# Patient Record
Sex: Female | Born: 1960 | Race: White | Hispanic: No | Marital: Married | Smoking: Never smoker
Health system: Southern US, Community
[De-identification: ages and names within clinical notes are randomized; demographics above are authoritative.]

## PROBLEM LIST (undated history)

## (undated) DIAGNOSIS — E039 Hypothyroidism, unspecified: Secondary | ICD-10-CM

## (undated) DIAGNOSIS — F329 Major depressive disorder, single episode, unspecified: Secondary | ICD-10-CM

## (undated) DIAGNOSIS — F32A Depression, unspecified: Secondary | ICD-10-CM

## (undated) DIAGNOSIS — R32 Unspecified urinary incontinence: Secondary | ICD-10-CM

## (undated) DIAGNOSIS — Z889 Allergy status to unspecified drugs, medicaments and biological substances status: Secondary | ICD-10-CM

## (undated) HISTORY — DX: Depression, unspecified: F32.A

## (undated) HISTORY — DX: Unspecified urinary incontinence: R32

## (undated) HISTORY — DX: Major depressive disorder, single episode, unspecified: F32.9

## (undated) HISTORY — DX: Allergy status to unspecified drugs, medicaments and biological substances: Z88.9

## (undated) HISTORY — DX: Hypothyroidism, unspecified: E03.9

---

## 1999-01-06 ENCOUNTER — Other Ambulatory Visit: Admission: RE | Admit: 1999-01-06 | Discharge: 1999-01-06 | Payer: Self-pay | Admitting: *Deleted

## 1999-12-16 ENCOUNTER — Other Ambulatory Visit: Admission: RE | Admit: 1999-12-16 | Discharge: 1999-12-16 | Payer: Self-pay | Admitting: *Deleted

## 2000-06-05 ENCOUNTER — Inpatient Hospital Stay (HOSPITAL_COMMUNITY): Admission: AD | Admit: 2000-06-05 | Discharge: 2000-06-08 | Payer: Self-pay | Admitting: Obstetrics and Gynecology

## 2000-06-05 ENCOUNTER — Encounter (INDEPENDENT_AMBULATORY_CARE_PROVIDER_SITE_OTHER): Payer: Self-pay | Admitting: Specialist

## 2000-07-12 ENCOUNTER — Other Ambulatory Visit: Admission: RE | Admit: 2000-07-12 | Discharge: 2000-07-12 | Payer: Self-pay | Admitting: *Deleted

## 2001-07-20 ENCOUNTER — Other Ambulatory Visit: Admission: RE | Admit: 2001-07-20 | Discharge: 2001-07-20 | Payer: Self-pay | Admitting: *Deleted

## 2002-08-15 ENCOUNTER — Other Ambulatory Visit: Admission: RE | Admit: 2002-08-15 | Discharge: 2002-08-15 | Payer: Self-pay | Admitting: *Deleted

## 2003-12-09 ENCOUNTER — Other Ambulatory Visit: Admission: RE | Admit: 2003-12-09 | Discharge: 2003-12-09 | Payer: Self-pay | Admitting: Internal Medicine

## 2004-02-19 ENCOUNTER — Ambulatory Visit (HOSPITAL_COMMUNITY): Admission: RE | Admit: 2004-02-19 | Discharge: 2004-02-19 | Payer: Self-pay | Admitting: Internal Medicine

## 2005-08-05 ENCOUNTER — Ambulatory Visit (HOSPITAL_COMMUNITY): Admission: RE | Admit: 2005-08-05 | Discharge: 2005-08-05 | Payer: Self-pay | Admitting: Internal Medicine

## 2005-12-29 ENCOUNTER — Other Ambulatory Visit: Admission: RE | Admit: 2005-12-29 | Discharge: 2005-12-29 | Payer: Self-pay | Admitting: Internal Medicine

## 2006-03-03 ENCOUNTER — Ambulatory Visit: Payer: Self-pay | Admitting: Family Medicine

## 2006-03-03 LAB — CONVERTED CEMR LAB
ALT: 25 units/L (ref 0–40)
AST: 20 units/L (ref 0–37)
Albumin: 3.6 g/dL (ref 3.5–5.2)
Alkaline Phosphatase: 62 units/L (ref 39–117)
BUN: 7 mg/dL (ref 6–23)
Basophils Absolute: 0 10*3/uL (ref 0.0–0.1)
Basophils Relative: 0.2 % (ref 0.0–1.0)
CO2: 30 meq/L (ref 19–32)
Calcium: 9.1 mg/dL (ref 8.4–10.5)
Chloride: 100 meq/L (ref 96–112)
Creatinine, Ser: 0.8 mg/dL (ref 0.4–1.2)
Eosinophil percent: 3.3 % (ref 0.0–5.0)
GFR calc non Af Amer: 83 mL/min
Glomerular Filtration Rate, Af Am: 101 mL/min/{1.73_m2}
Glucose, Bld: 77 mg/dL (ref 70–99)
HCT: 36.8 % (ref 36.0–46.0)
Hemoglobin: 12.3 g/dL (ref 12.0–15.0)
Lymphocytes Relative: 27.7 % (ref 12.0–46.0)
MCHC: 33.5 g/dL (ref 30.0–36.0)
MCV: 91.1 fL (ref 78.0–100.0)
Monocytes Absolute: 0.6 10*3/uL (ref 0.2–0.7)
Monocytes Relative: 8.4 % (ref 3.0–11.0)
Neutro Abs: 4.2 10*3/uL (ref 1.4–7.7)
Neutrophils Relative %: 60.4 % (ref 43.0–77.0)
Platelets: 376 10*3/uL (ref 150–400)
Potassium: 3.5 meq/L (ref 3.5–5.1)
RBC: 4.04 M/uL (ref 3.87–5.11)
RDW: 13.6 % (ref 11.5–14.6)
Sodium: 138 meq/L (ref 135–145)
TSH: 2.09 microintl units/mL (ref 0.35–5.50)
Total Bilirubin: 0.3 mg/dL (ref 0.3–1.2)
Total Protein: 7.1 g/dL (ref 6.0–8.3)
WBC: 6.9 10*3/uL (ref 4.5–10.5)

## 2006-03-10 ENCOUNTER — Encounter: Admission: RE | Admit: 2006-03-10 | Discharge: 2006-03-10 | Payer: Self-pay | Admitting: Family Medicine

## 2006-03-14 ENCOUNTER — Ambulatory Visit: Payer: Self-pay | Admitting: Family Medicine

## 2006-03-14 LAB — CONVERTED CEMR LAB
FSH: 28.9 milliintl units/mL
LH: 6.4 milliintl units/mL
Testosterone, total: 0.2181 ng/mL

## 2006-06-10 ENCOUNTER — Ambulatory Visit: Payer: Self-pay | Admitting: Family Medicine

## 2006-09-29 ENCOUNTER — Telehealth: Payer: Self-pay | Admitting: Family Medicine

## 2006-10-04 ENCOUNTER — Telehealth (INDEPENDENT_AMBULATORY_CARE_PROVIDER_SITE_OTHER): Payer: Self-pay | Admitting: *Deleted

## 2007-03-09 ENCOUNTER — Telehealth (INDEPENDENT_AMBULATORY_CARE_PROVIDER_SITE_OTHER): Payer: Self-pay | Admitting: *Deleted

## 2007-03-29 ENCOUNTER — Ambulatory Visit (HOSPITAL_COMMUNITY): Admission: RE | Admit: 2007-03-29 | Discharge: 2007-03-29 | Payer: Self-pay | Admitting: Family Medicine

## 2007-03-31 ENCOUNTER — Encounter (INDEPENDENT_AMBULATORY_CARE_PROVIDER_SITE_OTHER): Payer: Self-pay | Admitting: *Deleted

## 2007-04-04 ENCOUNTER — Telehealth (INDEPENDENT_AMBULATORY_CARE_PROVIDER_SITE_OTHER): Payer: Self-pay | Admitting: *Deleted

## 2008-02-13 IMAGING — US US TRANSVAGINAL NON-OB
1 series · 14 of 25 positions shown · non-contrast
Comparison: none

CLINICAL DATA: Vaginal bleeding.  Abdominal bloating.
 TRANSABDOMINAL AND TRANSVAGINAL PELVIC ULTRASOUND:
TECHNIQUE: Both transabdominal and transvaginal ultrasound examinations of the pelvis were performed including evaluation of the uterus, ovaries, adnexal regions, and pelvic cul-de-sac.

[Series 1: us transvaginal non-ob · 0.32mm/px · 14 of 59 slices shown]
[im 1/59]
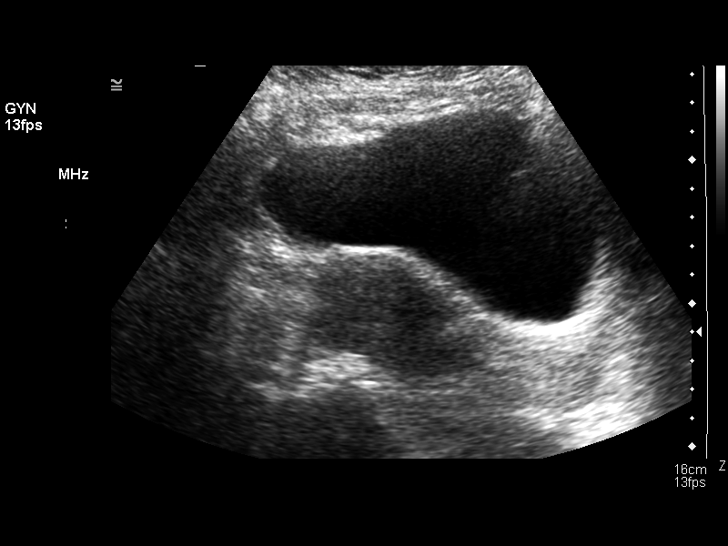
[im 5/59]
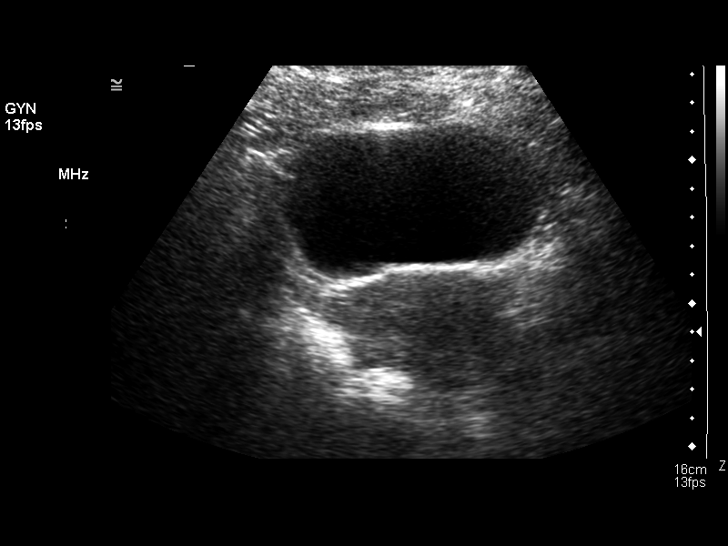
[im 10/59]
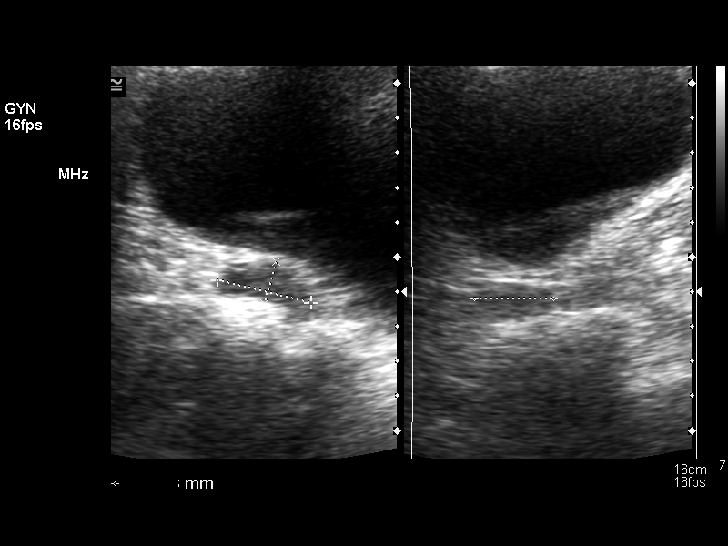
[im 15/59]
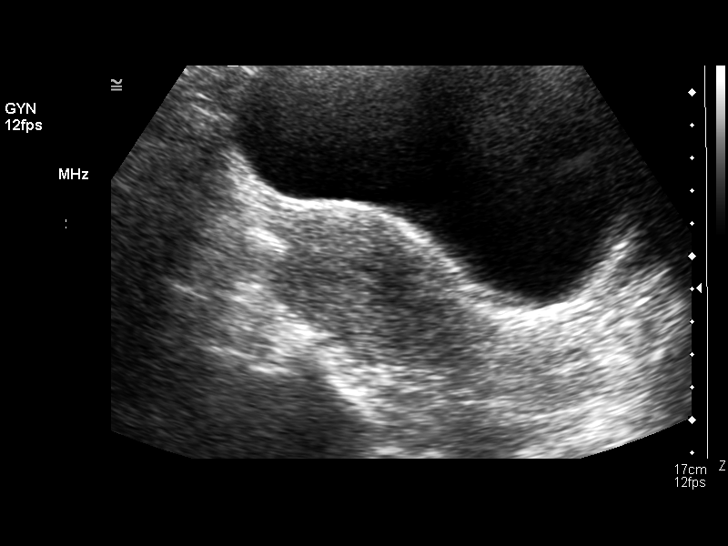
[im 20/59]
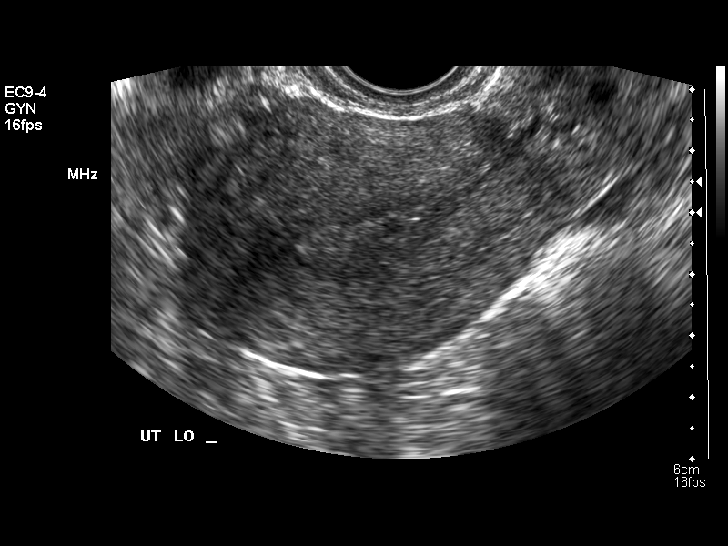
[im 22/59]
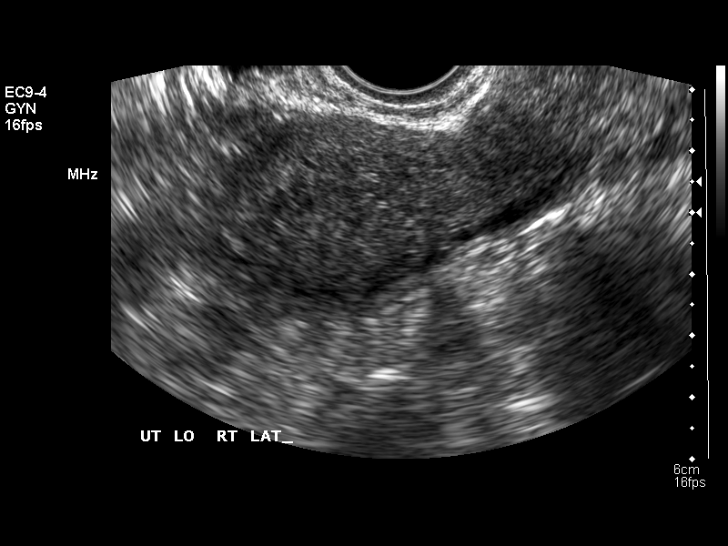
[im 27/59]
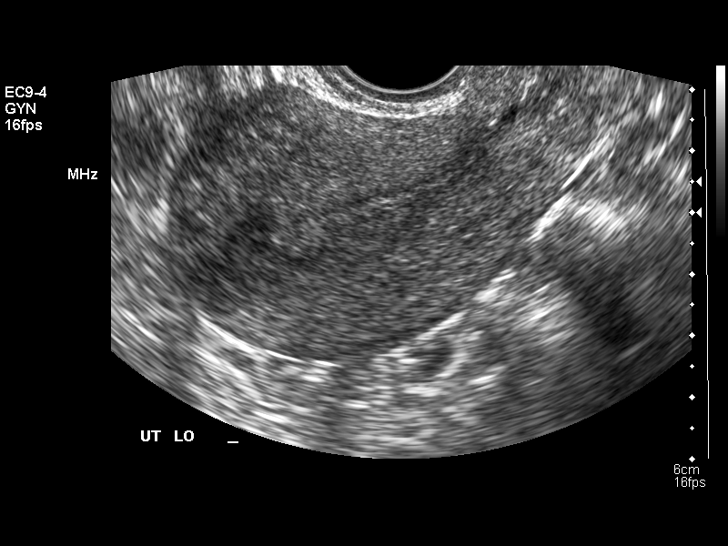
[im 32/59]
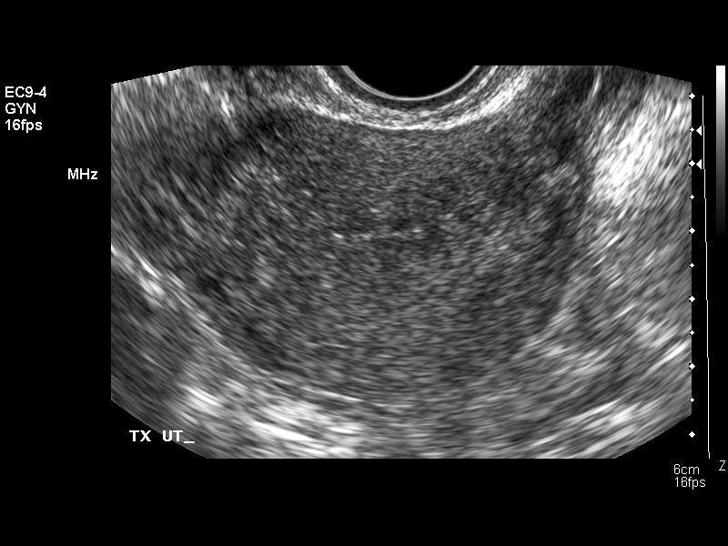
[im 37/59]
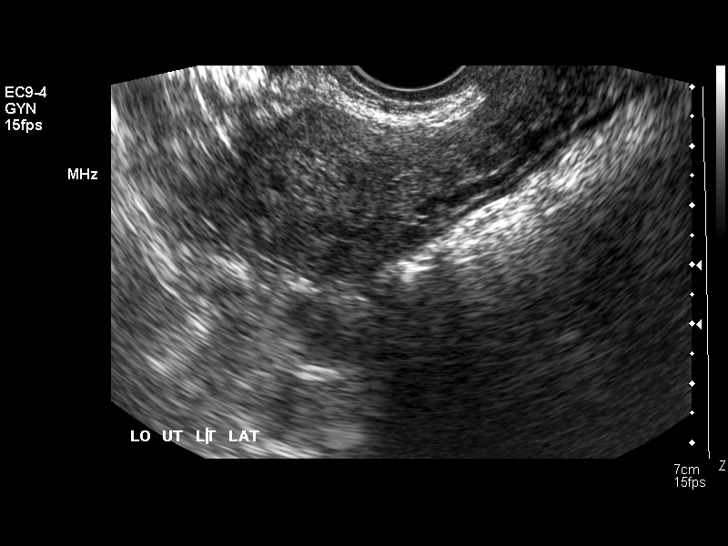
[im 39/59]
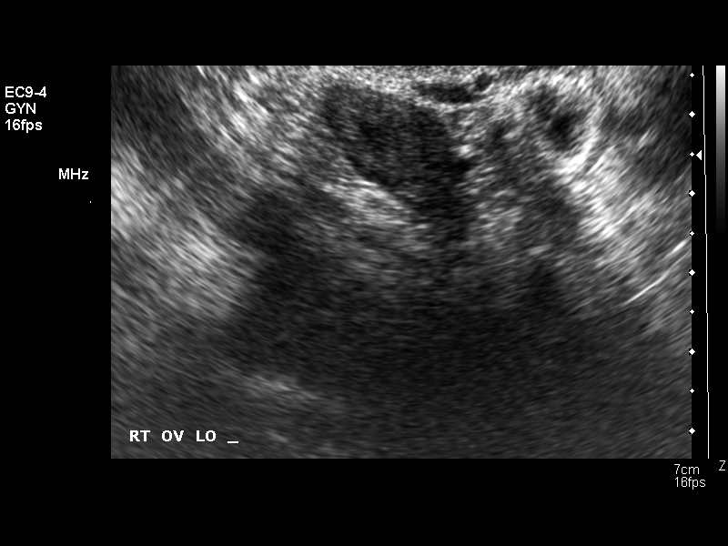
[im 44/59]
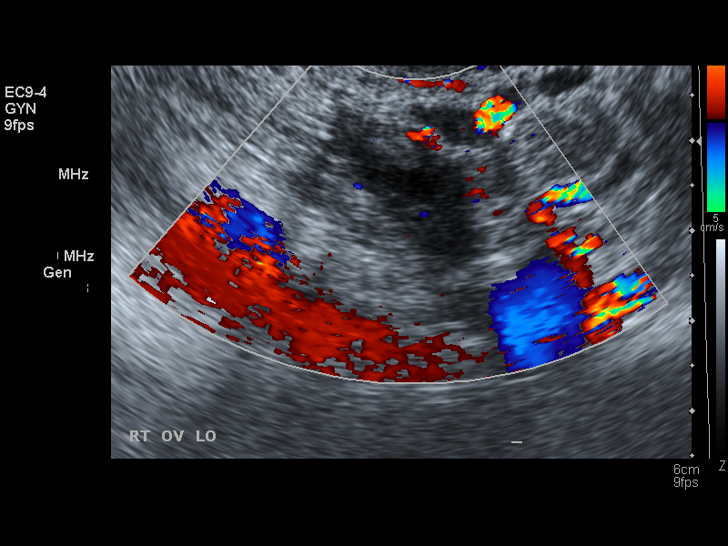
[im 49/59]
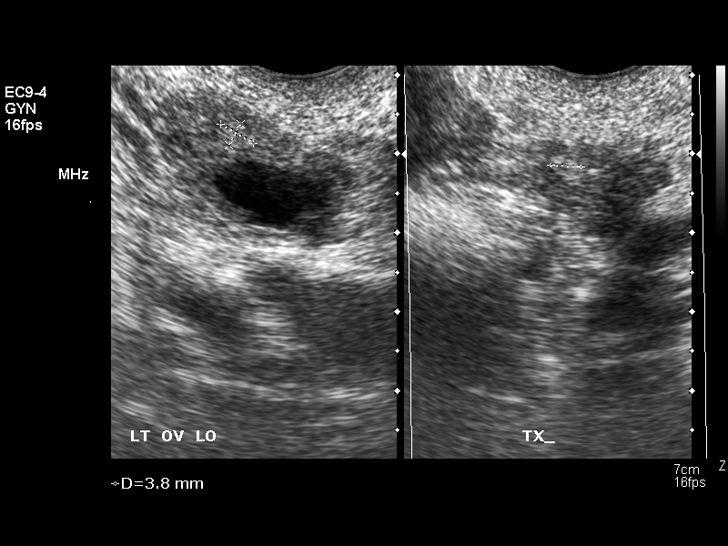
[im 54/59]
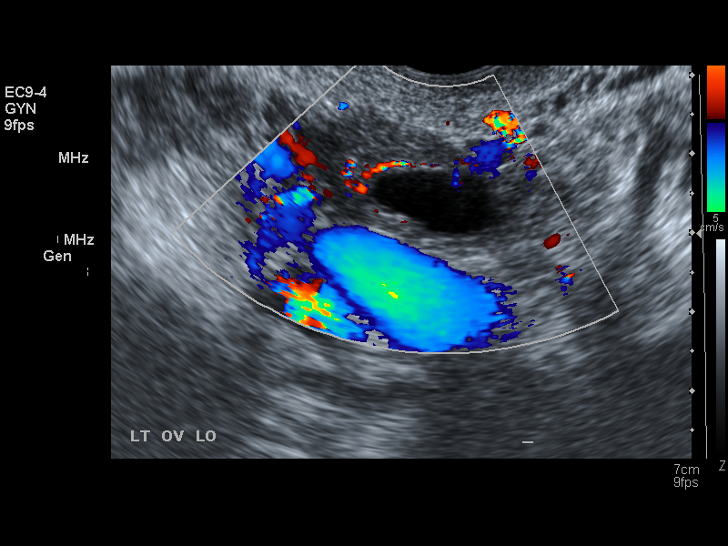
[im 59/59]
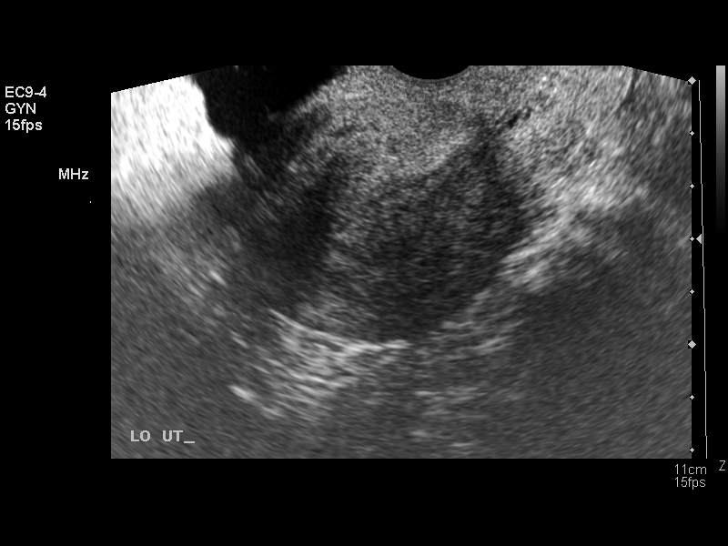

[14 of 25 positions shown; findings below may reference images not displayed]

FINDINGS: The uterus measures 10.2 cm sagittally with a depth of 4.6 cm and width of 5.9 cm.  The endometrium is normal in this premenopausal patient measuring 5.1 mm in thickness and it appears homogeneous.  The ovaries are normal in size.  There is a probable collapsed cyst on the right of 7 x 4 x 5 mm and probable collapsed cyst on the left of 5 x 3 x 4 mm.  Only a trace amount of free fluid is noted.
IMPRESSION: No significant abnormality on pelvic ultrasound.  The uterus and endometrium within normal limits.

## 2009-03-07 ENCOUNTER — Encounter: Admission: RE | Admit: 2009-03-07 | Discharge: 2009-03-07 | Payer: Self-pay | Admitting: Family Medicine

## 2010-09-11 NOTE — Discharge Summary (Signed)
[facility]  Patient:    Janet Carrillo, Janet Carrillo                       MRN: [id number] Adm. Date:  [id number] Disc. Date: [id number] Attending:  Madelyn Flavors Dictator:   Danie Chandler, R.N.                           Discharge Summary  ADMITTING DIAGNOSES:          1. Term intrauterine pregnancy.                               2. Rupture of membranes.                               3. Labor.                               4. Previous cesarean section, declines vaginal                                  birth after cesarean section.                               5. Requests permanent sterilization.  DISCHARGE DIAGNOSES:          1. Term intrauterine pregnancy.                               2. Rupture of membranes.                               3. Labor.                               4. Previous cesarean section, declines vaginal                                  birth after cesarean section.                               5. Requests permanent sterilization.  PROCEDURE:                    On June 05, 2000 repeat low transverse cesarean section and modified Pomeroy tubal ligation.  REASON FOR ADMISSION:         The patient is a 50 year old married white female gravida 3, para 1 with an estimated date of confinement of June 20, 2000 who presented at 37 6/7 weeks with uterine contractions.  The patient had no cervical change in the maternity admissions unit so the patient received Stadol.  She continued to contract without cervical change except for slight effacement.  The decision was made to proceed with repeat cesarean section.  HOSPITAL COURSE:              The patient was taken to  the operating room and underwent the above named procedure without complication.  This was productive of a viable female infant with Apgars of 8 at one minute and 9 at five minutes.  Postoperatively on day #1 the patients hemoglobin was 9.9, hematocrit 29.2, and white  blood cell count 10.3.  On postoperative day #2 the patient had a good return of bowel function and was tolerating a regular diet. She was also ambulating well without difficulty and had good pain control. She was discharged home on postoperative day #3.  CONDITION ON DISCHARGE:       Good.  DIET:                         Regular, as tolerated.  ACTIVITY:                     No heavy lifting, driving, vaginal entry.  FOLLOW-UP:                    She is to follow-up in the office in one to two weeks for incision check.  She is to call for temperature greater than 100 degrees, persistent nausea or vomiting, heavy vaginal bleeding, and/or redness or drainage from the incision site.  DISCHARGE MEDICATIONS:        1. Prenatal vitamin one p.o. q.d.                               2. Tylox #30 one to two p.o. q.4h. p.r.n. pain.DD:  06/08/00 TD:  06/08/00 Job: [id number]

## 2010-09-11 NOTE — Op Note (Signed)
[facility]  Patient:    Janet Carrillo, Janet Carrillo                         MRN: [id number] Proc. Date: 06/05/00 Attending:  Duke Salvia. Marcelle Overlie, M.D.                           Operative Report  PREOPERATIVE DIAGNOSES:       1. Term intrauterine pregnancy.                               2. Rupture of membranes.                               3. Labor.                               4. Previous cesarean section, declines vaginal                                  birth after cesarean section.                               5. Requests permanent sterilization.  POSTOPERATIVE DIAGNOSES:      1. Term intrauterine pregnancy.                               2. Rupture of membranes.                               3. Labor.                               4. Previous cesarean section, declines vaginal                                  birth after cesarean section.                               5. Requests permanent sterilization.  PROCEDURES:                   1. Repeat low transverse cesarean section.                               2. Modified Pomeroy tubal ligation.  SURGEON:                      Duke Salvia. Marcelle Overlie, M.D.  ANESTHESIA:                   Epidural.  COMPLICATIONS:                None.  DRAINS:                       Foley catheter.  ESTIMATED  BLOOD LOSS:         800.  PROCEDURE AND FINDINGS:       The patient was taken to the operating room. After an adequate level of epidural anesthesia was obtained with the patient in the supine position, the abdomen was prepped and draped in the usual manner for sterile abdominal procedures.  A Foley catheter was in position draining clear urine.  A transverse incision was made through the old Pfannenstiel scar, which was well healed, and was carried down to the fascia then extended transversely.  The rectus muscle was divided in the midline.  The peritoneum was entered superiorly without incident and extended in a vertical  manner. The vesicouterine serosa was then incised and the bladder was bluntly and sharply dissected off of the lower uterine segment.  The bladder blade was repositioned.  A transverse incision was made in the lower segment and extended with blunt dissection.  This are was relatively thin.  Moderate meconium was noted.  The vertex was delivered without difficulty.  DeLee and bulb suction was carried out.  The infant was delivered and passed to the pediatric team for further suctioning.  Apgars were 9 and 9.  Cord pH was 7.21.  THe placenta was delivered manually intact and sent to pathology.  The uterus was exteriorized and the cavity wiped clean with a laparotomy pack. Closure was obtained with a first layer of 0 chromic in a locking fashion followed by an imbricating layer of 0 chromic.  This was hemostatic.  The tubes and ovaries were inspected and noted to be normal.  The bladder flap area was intact and hemostatic.  Starting on the right tube, the mid segment portion of tube was elevated with a Babcock clamp.  A 0 plain suture was tied around each end of a mid segment knuckle of tube, which was excised, cut and cauterized with the Bovie and a 2-0 silk suture placed around the proximal segment.  This was hemostatic.  The exact same was repeated on the opposite side.  After this was completed, reinspection of the uterine incision revealed it to be hemostatic.  The uterus was returned to its intra-abdominal position. Irrigation was carried out.  Prior to closure, sponge, needle and instrument counts were reported as correct x 2.  The rectus muscle was reapproximated with 2-0 Dexon interrupted sutures.  The fascia was closed from lateral to midline on either side with a 0 Dexon running suture.  The subcutaneous fat was hemostatic.  Clips and sick sinus syndrome were used on the skin.  She tolerated this well and went to the recovery room in good condition.  She had preoperatively received  ampicillin IV. DD:  06/05/00 TD:  06/05/00 Job: [id number]

## 2011-01-20 ENCOUNTER — Other Ambulatory Visit: Payer: Self-pay | Admitting: Nurse Practitioner

## 2011-01-20 DIAGNOSIS — N949 Unspecified condition associated with female genital organs and menstrual cycle: Secondary | ICD-10-CM

## 2011-01-27 ENCOUNTER — Other Ambulatory Visit: Payer: Self-pay

## 2011-08-25 ENCOUNTER — Other Ambulatory Visit: Payer: Self-pay | Admitting: Family Medicine

## 2011-08-25 DIAGNOSIS — Z1231 Encounter for screening mammogram for malignant neoplasm of breast: Secondary | ICD-10-CM

## 2011-09-15 ENCOUNTER — Other Ambulatory Visit: Payer: Self-pay | Admitting: Family Medicine

## 2011-09-15 DIAGNOSIS — Z1231 Encounter for screening mammogram for malignant neoplasm of breast: Secondary | ICD-10-CM

## 2011-09-17 ENCOUNTER — Ambulatory Visit: Payer: Self-pay

## 2011-09-22 ENCOUNTER — Ambulatory Visit
Admission: RE | Admit: 2011-09-22 | Discharge: 2011-09-22 | Disposition: A | Discharge status: Home / Self Care | Payer: BC Managed Care – PPO | Source: Ambulatory Visit | Attending: Family Medicine | Admitting: Family Medicine

## 2011-09-22 DIAGNOSIS — Z1231 Encounter for screening mammogram for malignant neoplasm of breast: Secondary | ICD-10-CM

## 2011-10-05 ENCOUNTER — Ambulatory Visit (HOSPITAL_COMMUNITY): Payer: Self-pay

## 2012-05-11 ENCOUNTER — Ambulatory Visit: Payer: BC Managed Care – PPO | Admitting: Family Medicine

## 2012-05-12 ENCOUNTER — Ambulatory Visit: Payer: BC Managed Care – PPO | Admitting: Family Medicine

## 2012-05-17 ENCOUNTER — Ambulatory Visit: Payer: BC Managed Care – PPO | Admitting: Family Medicine

## 2012-05-30 ENCOUNTER — Encounter: Payer: Self-pay | Admitting: Family Medicine

## 2012-05-30 ENCOUNTER — Other Ambulatory Visit (HOSPITAL_COMMUNITY)
Admission: RE | Admit: 2012-05-30 | Discharge: 2012-05-30 | Disposition: A | Discharge status: Home / Self Care | Payer: BC Managed Care – PPO | Source: Ambulatory Visit | Attending: Family Medicine | Admitting: Family Medicine

## 2012-05-30 ENCOUNTER — Ambulatory Visit (INDEPENDENT_AMBULATORY_CARE_PROVIDER_SITE_OTHER): Payer: BC Managed Care – PPO | Admitting: Family Medicine

## 2012-05-30 VITALS — BP 120/84 | HR 94 | Temp 98.3°F | Ht 62.0 in | Wt 168.0 lb

## 2012-05-30 DIAGNOSIS — E039 Hypothyroidism, unspecified: Secondary | ICD-10-CM | POA: Insufficient documentation

## 2012-05-30 DIAGNOSIS — Z23 Encounter for immunization: Secondary | ICD-10-CM

## 2012-05-30 DIAGNOSIS — Z0000 Encounter for general adult medical examination without abnormal findings: Secondary | ICD-10-CM

## 2012-05-30 DIAGNOSIS — F329 Major depressive disorder, single episode, unspecified: Secondary | ICD-10-CM

## 2012-05-30 DIAGNOSIS — Z780 Asymptomatic menopausal state: Secondary | ICD-10-CM | POA: Insufficient documentation

## 2012-05-30 DIAGNOSIS — Z01419 Encounter for gynecological examination (general) (routine) without abnormal findings: Secondary | ICD-10-CM | POA: Insufficient documentation

## 2012-05-30 MED ORDER — BUPROPION HCL ER (XL) 150 MG PO TB24: 450.0000 mg | ORAL_TABLET | Freq: Every day | ORAL | Status: DC

## 2012-05-30 NOTE — Assessment & Plan Note (Signed)
con't meds meds and labs done by [facility]

## 2012-05-30 NOTE — Progress Notes (Signed)
Subjective:     Janet Carrillo is a 52 y.o. female and is here for a comprehensive physical exam. The patient reports no problems.  History   Social History  . Marital Status: Married    Spouse Name: N/A    Number of Children: N/A  . Years of Education: N/A   Occupational History  .      self employed   Social History Main Topics  . Smoking status: Never Smoker   . Smokeless tobacco: Never Used  . Alcohol Use: No  . Drug Use: No  . Sexually Active: Not on file   Other Topics Concern  . Not on file   Social History Narrative   Exercise-- no   Health Maintenance  Topic Date Due  . Tetanus/tdap  11/28/1981  . Influenza Vaccine  12/26/2011  . Pap Smear  05/31/2015    The following portions of the patient's history were reviewed and updated as appropriate:  She  has a past medical history of Depression; H/O seasonal allergies; Hypothyroidism; and Urinary incontinence. She  does not have any pertinent problems on file. She  has past surgical history that includes Cesarean section. Her family history includes Anxiety disorder in her father and mother; Arthritis in her mother; Diabetes in her father and mother; Heart disease in her father and mother; Hyperlipidemia in her father and mother; Hypertension in her father and mother; and Kidney disease in her father. She  reports that she has never smoked. She has never used smokeless tobacco. She reports that she does not drink alcohol or use illicit drugs. She has a current medication list which includes the following prescription(s): b-complex, vitamin d3, co-enzyme q-10, estradiol, liothyronine, pcca t3 sodium, progesterone (bulk), vitamin c, wellbutrin xl, and zolpidem. Current Outpatient Prescriptions on File Prior to Visit  Medication Sig Dispense Refill  . Estradiol POWD Biest      . liothyronine (CYTOMEL) 50 MCG tablet Take 50 mcg by mouth daily.      . Progesterone Micronized (PROGESTERONE, BULK,) POWD Bi-est      .  WELLBUTRIN XL 150 MG 24 hr tablet Take 3 tablets by mouth daily.      Marland Kitchen zolpidem (AMBIEN) 10 MG tablet Take 1 tablet by mouth as needed.       She  has no known allergies..  Review of Systems Review of Systems  Constitutional: Negative for activity change, appetite change and fatigue.  HENT: Negative for hearing loss, congestion, tinnitus and ear discharge.  dentist q13m Eyes: Negative for visual disturbance (see optho q1y -- vision corrected to 20/20 with glasses).  Respiratory: Negative for cough, chest tightness and shortness of breath.   Cardiovascular: Negative for chest pain, palpitations and leg swelling.  Gastrointestinal: Negative for abdominal pain, diarrhea, constipation and abdominal distention.  Genitourinary: Negative for urgency, frequency, decreased urine volume and difficulty urinating.  Musculoskeletal: Negative for back pain, arthralgias and gait problem.  Skin: Negative for color change, pallor and rash.  Neurological: Negative for dizziness, light-headedness, numbness and headaches.  Hematological: Negative for adenopathy. Does not bruise/bleed easily.  Psychiatric/Behavioral: Negative for suicidal ideas, confusion, sleep disturbance, self-injury, dysphoric mood, decreased concentration and agitation.       Objective:    BP 120/84  Pulse 94  Temp 98.3 F (36.8 C) (Oral)  Ht 5\' 2"  (1.575 m)  Wt 168 lb (76.204 kg)  BMI 30.73 kg/m2  SpO2 97% General appearance: alert, cooperative, appears stated age and no distress Head: Normocephalic, without obvious abnormality,  atraumatic Eyes: conjunctivae/corneas clear. PERRL, EOM's intact. Fundi benign. Ears: normal TM's and external ear canals both ears Nose: Nares normal. Septum midline. Mucosa normal. No drainage or sinus tenderness. Throat: lips, mucosa, and tongue normal; teeth and gums normal Neck: no adenopathy, no carotid bruit, no JVD, supple, symmetrical, trachea midline and thyroid not enlarged, symmetric, no  tenderness/mass/nodules Back: symmetric, no curvature. ROM normal. No CVA tenderness. Lungs: clear to auscultation bilaterally Breasts: normal appearance, no masses or tenderness Heart: regular rate and rhythm, S1, S2 normal, no murmur, click, rub or gallop Abdomen: soft, non-tender; bowel sounds normal; no masses,  no organomegaly Pelvic: cervix normal in appearance, external genitalia normal, no adnexal masses or tenderness, no cervical motion tenderness, rectovaginal septum normal, uterus normal size, shape, and consistency and vagina normal without discharge Extremities: extremities normal, atraumatic, no cyanosis or edema Pulses: 2+ and symmetric Skin: Skin color, texture, turgor normal. No rashes or lesions Lymph nodes: Cervical, supraclavicular, and axillary nodes normal. Neurologic: Alert and oriented X 3, normal strength and tone. Normal symmetric reflexes. Normal coordination and gait psych-- no depression, no anxiety    Assessment:    Healthy female exam.      Plan:     See After Visit Summary for Counseling Recommendations

## 2012-05-30 NOTE — Patient Instructions (Addendum)
Preventive Care for Adults, Female A healthy lifestyle and preventive care can promote health and wellness. Preventive health guidelines for women include the following key practices.  A routine yearly physical is a good way to check with your caregiver about your health and preventive screening. It is a chance to share any concerns and updates on your health, and to receive a thorough exam.  Visit your dentist for a routine exam and preventive care every 6 months. Brush your teeth twice a day and floss once a day. Good oral hygiene prevents tooth decay and gum disease.  The frequency of eye exams is based on your age, health, family medical history, use of contact lenses, and other factors. Follow your caregiver's recommendations for frequency of eye exams.  Eat a healthy diet. Foods like vegetables, fruits, whole grains, low-fat dairy products, and lean protein foods contain the nutrients you need without too many calories. Decrease your intake of foods high in solid fats, added sugars, and salt. Eat the right amount of calories for you.Get information about a proper diet from your caregiver, if necessary.  Regular physical exercise is one of the most important things you can do for your health. Most adults should get at least 150 minutes of moderate-intensity exercise (any activity that increases your heart rate and causes you to sweat) each week. In addition, most adults need muscle-strengthening exercises on 2 or more days a week.  Maintain a healthy weight. The body mass index (BMI) is a screening tool to identify possible weight problems. It provides an estimate of body fat based on height and weight. Your caregiver can help determine your BMI, and can help you achieve or maintain a healthy weight.For adults 20 years and older:  A BMI below 18.5 is considered underweight.  A BMI of 18.5 to 24.9 is normal.  A BMI of 25 to 29.9 is considered overweight.  A BMI of 30 and above is  considered obese.  Maintain normal blood lipids and cholesterol levels by exercising and minimizing your intake of saturated fat. Eat a balanced diet with plenty of fruit and vegetables. Blood tests for lipids and cholesterol should begin at age 20 and be repeated every 5 years. If your lipid or cholesterol levels are high, you are over 50, or you are at high risk for heart disease, you may need your cholesterol levels checked more frequently.Ongoing high lipid and cholesterol levels should be treated with medicines if diet and exercise are not effective.  If you smoke, find out from your caregiver how to quit. If you do not use tobacco, do not start.  If you are pregnant, do not drink alcohol. If you are breastfeeding, be very cautious about drinking alcohol. If you are not pregnant and choose to drink alcohol, do not exceed 1 drink per day. One drink is considered to be 12 ounces (355 mL) of beer, 5 ounces (148 mL) of wine, or 1.5 ounces (44 mL) of liquor.  Avoid use of street drugs. Do not share needles with anyone. Ask for help if you need support or instructions about stopping the use of drugs.  High blood pressure causes heart disease and increases the risk of stroke. Your blood pressure should be checked at least every 1 to 2 years. Ongoing high blood pressure should be treated with medicines if weight loss and exercise are not effective.  If you are 55 to 50 years old, ask your caregiver if you should take aspirin to prevent strokes.  Diabetes   screening involves taking a blood sample to check your fasting blood sugar level. This should be done once every 3 years, after age 45, if you are within normal weight and without risk factors for diabetes. Testing should be considered at a younger age or be carried out more frequently if you are overweight and have at least 1 risk factor for diabetes.  Breast cancer screening is essential preventive care for women. You should practice "breast  self-awareness." This means understanding the normal appearance and feel of your breasts and may include breast self-examination. Any changes detected, no matter how small, should be reported to a caregiver. Women in their 20s and 30s should have a clinical breast exam (CBE) by a caregiver as part of a regular health exam every 1 to 3 years. After age 40, women should have a CBE every year. Starting at age 40, women should consider having a mammography (breast X-ray test) every year. Women who have a family history of breast cancer should talk to their caregiver about genetic screening. Women at a high risk of breast cancer should talk to their caregivers about having magnetic resonance imaging (MRI) and a mammography every year.  The Pap test is a screening test for cervical cancer. A Pap test can show cell changes on the cervix that might become cervical cancer if left untreated. A Pap test is a procedure in which cells are obtained and examined from the lower end of the uterus (cervix).  Women should have a Pap test starting at age 21.  Between ages 21 and 29, Pap tests should be repeated every 2 years.  Beginning at age 30, you should have a Pap test every 3 years as long as the past 3 Pap tests have been normal.  Some women have medical problems that increase the chance of getting cervical cancer. Talk to your caregiver about these problems. It is especially important to talk to your caregiver if a new problem develops soon after your last Pap test. In these cases, your caregiver may recommend more frequent screening and Pap tests.  The above recommendations are the same for women who have or have not gotten the vaccine for human papillomavirus (HPV).  If you had a hysterectomy for a problem that was not cancer or a condition that could lead to cancer, then you no longer need Pap tests. Even if you no longer need a Pap test, a regular exam is a good idea to make sure no other problems are  starting.  If you are between ages 65 and 70, and you have had normal Pap tests going back 10 years, you no longer need Pap tests. Even if you no longer need a Pap test, a regular exam is a good idea to make sure no other problems are starting.  If you have had past treatment for cervical cancer or a condition that could lead to cancer, you need Pap tests and screening for cancer for at least 20 years after your treatment.  If Pap tests have been discontinued, risk factors (such as a new sexual partner) need to be reassessed to determine if screening should be resumed.  The HPV test is an additional test that may be used for cervical cancer screening. The HPV test looks for the virus that can cause the cell changes on the cervix. The cells collected during the Pap test can be tested for HPV. The HPV test could be used to screen women aged 30 years and older, and should   be used in women of any age who have unclear Pap test results. After the age of 30, women should have HPV testing at the same frequency as a Pap test.  Colorectal cancer can be detected and often prevented. Most routine colorectal cancer screening begins at the age of 50 and continues through age 75. However, your caregiver may recommend screening at an earlier age if you have risk factors for colon cancer. On a yearly basis, your caregiver may provide home test kits to check for hidden blood in the stool. Use of a small camera at the end of a tube, to directly examine the colon (sigmoidoscopy or colonoscopy), can detect the earliest forms of colorectal cancer. Talk to your caregiver about this at age 50, when routine screening begins. Direct examination of the colon should be repeated every 5 to 10 years through age 75, unless early forms of pre-cancerous polyps or small growths are found.  Hepatitis C blood testing is recommended for all people born from 1945 through 1965 and any individual with known risks for hepatitis C.  Practice  safe sex. Use condoms and avoid high-risk sexual practices to reduce the spread of sexually transmitted infections (STIs). STIs include gonorrhea, chlamydia, syphilis, trichomonas, herpes, HPV, and human immunodeficiency virus (HIV). Herpes, HIV, and HPV are viral illnesses that have no cure. They can result in disability, cancer, and death. Sexually active women aged 25 and younger should be checked for chlamydia. Older women with new or multiple partners should also be tested for chlamydia. Testing for other STIs is recommended if you are sexually active and at increased risk.  Osteoporosis is a disease in which the bones lose minerals and strength with aging. This can result in serious bone fractures. The risk of osteoporosis can be identified using a bone density scan. Women ages 65 and over and women at risk for fractures or osteoporosis should discuss screening with their caregivers. Ask your caregiver whether you should take a calcium supplement or vitamin D to reduce the rate of osteoporosis.  Menopause can be associated with physical symptoms and risks. Hormone replacement therapy is available to decrease symptoms and risks. You should talk to your caregiver about whether hormone replacement therapy is right for you.  Use sunscreen with sun protection factor (SPF) of 30 or more. Apply sunscreen liberally and repeatedly throughout the day. You should seek shade when your shadow is shorter than you. Protect yourself by wearing long sleeves, pants, a wide-brimmed hat, and sunglasses year round, whenever you are outdoors.  Once a month, do a whole body skin exam, using a mirror to look at the skin on your back. Notify your caregiver of new moles, moles that have irregular borders, moles that are larger than a pencil eraser, or moles that have changed in shape or color.  Stay current with required immunizations.  Influenza. You need a dose every fall (or winter). The composition of the flu vaccine  changes each year, so being vaccinated once is not enough.  Pneumococcal polysaccharide. You need 1 to 2 doses if you smoke cigarettes or if you have certain chronic medical conditions. You need 1 dose at age 65 (or older) if you have never been vaccinated.  Tetanus, diphtheria, pertussis (Tdap, Td). Get 1 dose of Tdap vaccine if you are younger than age 65, are over 65 and have contact with an infant, are a healthcare worker, are pregnant, or simply want to be protected from whooping cough. After that, you need a Td   booster dose every 10 years. Consult your caregiver if you have not had at least 3 tetanus and diphtheria-containing shots sometime in your life or have a deep or dirty wound.  HPV. You need this vaccine if you are a woman age 26 or younger. The vaccine is given in 3 doses over 6 months.  Measles, mumps, rubella (MMR). You need at least 1 dose of MMR if you were born in 1957 or later. You may also need a second dose.  Meningococcal. If you are age 19 to 21 and a first-year college student living in a residence hall, or have one of several medical conditions, you need to get vaccinated against meningococcal disease. You may also need additional booster doses.  Zoster (shingles). If you are age 60 or older, you should get this vaccine.  Varicella (chickenpox). If you have never had chickenpox or you were vaccinated but received only 1 dose, talk to your caregiver to find out if you need this vaccine.  Hepatitis A. You need this vaccine if you have a specific risk factor for hepatitis A virus infection or you simply wish to be protected from this disease. The vaccine is usually given as 2 doses, 6 to 18 months apart.  Hepatitis B. You need this vaccine if you have a specific risk factor for hepatitis B virus infection or you simply wish to be protected from this disease. The vaccine is given in 3 doses, usually over 6 months. Preventive Services / Frequency Ages 19 to 39  Blood  pressure check.** / Every 1 to 2 years.  Lipid and cholesterol check.** / Every 5 years beginning at age 20.  Clinical breast exam.** / Every 3 years for women in their 20s and 30s.  Pap test.** / Every 2 years from ages 21 through 29. Every 3 years starting at age 30 through age 65 or 70 with a history of 3 consecutive normal Pap tests.  HPV screening.** / Every 3 years from ages 30 through ages 65 to 70 with a history of 3 consecutive normal Pap tests.  Hepatitis C blood test.** / For any individual with known risks for hepatitis C.  Skin self-exam. / Monthly.  Influenza immunization.** / Every year.  Pneumococcal polysaccharide immunization.** / 1 to 2 doses if you smoke cigarettes or if you have certain chronic medical conditions.  Tetanus, diphtheria, pertussis (Tdap, Td) immunization. / A one-time dose of Tdap vaccine. After that, you need a Td booster dose every 10 years.  HPV immunization. / 3 doses over 6 months, if you are 26 and younger.  Measles, mumps, rubella (MMR) immunization. / You need at least 1 dose of MMR if you were born in 1957 or later. You may also need a second dose.  Meningococcal immunization. / 1 dose if you are age 19 to 21 and a first-year college student living in a residence hall, or have one of several medical conditions, you need to get vaccinated against meningococcal disease. You may also need additional booster doses.  Varicella immunization.** / Consult your caregiver.  Hepatitis A immunization.** / Consult your caregiver. 2 doses, 6 to 18 months apart.  Hepatitis B immunization.** / Consult your caregiver. 3 doses usually over 6 months. Ages 40 to 64  Blood pressure check.** / Every 1 to 2 years.  Lipid and cholesterol check.** / Every 5 years beginning at age 20.  Clinical breast exam.** / Every year after age 40.  Mammogram.** / Every year beginning at age 40   and continuing for as long as you are in good health. Consult with your  caregiver.  Pap test.** / Every 3 years starting at age 30 through age 65 or 70 with a history of 3 consecutive normal Pap tests.  HPV screening.** / Every 3 years from ages 30 through ages 65 to 70 with a history of 3 consecutive normal Pap tests.  Fecal occult blood test (FOBT) of stool. / Every year beginning at age 50 and continuing until age 75. You may not need to do this test if you get a colonoscopy every 10 years.  Flexible sigmoidoscopy or colonoscopy.** / Every 5 years for a flexible sigmoidoscopy or every 10 years for a colonoscopy beginning at age 50 and continuing until age 75.  Hepatitis C blood test.** / For all people born from 1945 through 1965 and any individual with known risks for hepatitis C.  Skin self-exam. / Monthly.  Influenza immunization.** / Every year.  Pneumococcal polysaccharide immunization.** / 1 to 2 doses if you smoke cigarettes or if you have certain chronic medical conditions.  Tetanus, diphtheria, pertussis (Tdap, Td) immunization.** / A one-time dose of Tdap vaccine. After that, you need a Td booster dose every 10 years.  Measles, mumps, rubella (MMR) immunization. / You need at least 1 dose of MMR if you were born in 1957 or later. You may also need a second dose.  Varicella immunization.** / Consult your caregiver.  Meningococcal immunization.** / Consult your caregiver.  Hepatitis A immunization.** / Consult your caregiver. 2 doses, 6 to 18 months apart.  Hepatitis B immunization.** / Consult your caregiver. 3 doses, usually over 6 months. Ages 65 and over  Blood pressure check.** / Every 1 to 2 years.  Lipid and cholesterol check.** / Every 5 years beginning at age 20.  Clinical breast exam.** / Every year after age 40.  Mammogram.** / Every year beginning at age 40 and continuing for as long as you are in good health. Consult with your caregiver.  Pap test.** / Every 3 years starting at age 30 through age 65 or 70 with a 3  consecutive normal Pap tests. Testing can be stopped between 65 and 70 with 3 consecutive normal Pap tests and no abnormal Pap or HPV tests in the past 10 years.  HPV screening.** / Every 3 years from ages 30 through ages 65 or 70 with a history of 3 consecutive normal Pap tests. Testing can be stopped between 65 and 70 with 3 consecutive normal Pap tests and no abnormal Pap or HPV tests in the past 10 years.  Fecal occult blood test (FOBT) of stool. / Every year beginning at age 50 and continuing until age 75. You may not need to do this test if you get a colonoscopy every 10 years.  Flexible sigmoidoscopy or colonoscopy.** / Every 5 years for a flexible sigmoidoscopy or every 10 years for a colonoscopy beginning at age 50 and continuing until age 75.  Hepatitis C blood test.** / For all people born from 1945 through 1965 and any individual with known risks for hepatitis C.  Osteoporosis screening.** / A one-time screening for women ages 65 and over and women at risk for fractures or osteoporosis.  Skin self-exam. / Monthly.  Influenza immunization.** / Every year.  Pneumococcal polysaccharide immunization.** / 1 dose at age 65 (or older) if you have never been vaccinated.  Tetanus, diphtheria, pertussis (Tdap, Td) immunization. / A one-time dose of Tdap vaccine if you are over   65 and have contact with an infant, are a healthcare worker, or simply want to be protected from whooping cough. After that, you need a Td booster dose every 10 years.  Varicella immunization.** / Consult your caregiver.  Meningococcal immunization.** / Consult your caregiver.  Hepatitis A immunization.** / Consult your caregiver. 2 doses, 6 to 18 months apart.  Hepatitis B immunization.** / Check with your caregiver. 3 doses, usually over 6 months. ** Family history and personal history of risk and conditions may change your caregiver's recommendations. Document Released: 06/08/2001 Document Revised: 07/05/2011  Document Reviewed: 09/07/2010 [employer] Patient Information 2013 [employer].  

## 2012-06-05 ENCOUNTER — Telehealth: Payer: Self-pay | Admitting: Family Medicine

## 2012-06-05 DIAGNOSIS — F329 Major depressive disorder, single episode, unspecified: Secondary | ICD-10-CM

## 2012-06-05 MED ORDER — WELLBUTRIN XL 150 MG PO TB24: 450.0000 mg | ORAL_TABLET | Freq: Every day | ORAL | Status: DC

## 2012-06-05 NOTE — Telephone Encounter (Signed)
Rx resent for Brand name per patient request.      KP

## 2012-06-05 NOTE — Telephone Encounter (Signed)
Janet Carrillo; DOB:25-Apr-1961; Call back number: [phone]  Pt calling and states that she can only take the brand name Wellbutrin. The generic doesn't work for her.  The office sent in a refill to [facility] mail order pharmacy and the pharmacy filled it with generic.  Pt is requesting that you send in another refill for the Brand Name Wellbutrin to [facility] again.  She has enough of the meds to last her until she receives the new Rx in the mail. She can not take the generic. Denies any sx.  PT IS REQUESTING THAT A NOTE IS PLACED ON HER CHART TO ONLY FILL WITH BRAND NAME WELLBUTRIN TO PREVENT THIS FROM HAPPENING AGAIN.

## 2012-08-29 ENCOUNTER — Ambulatory Visit (INDEPENDENT_AMBULATORY_CARE_PROVIDER_SITE_OTHER): Payer: BC Managed Care – PPO | Admitting: Licensed Clinical Social Worker

## 2012-08-29 DIAGNOSIS — IMO0002 Reserved for concepts with insufficient information to code with codable children: Secondary | ICD-10-CM

## 2013-05-29 ENCOUNTER — Ambulatory Visit: Payer: BC Managed Care – PPO | Admitting: Internal Medicine

## 2013-07-24 ENCOUNTER — Telehealth: Payer: Self-pay | Admitting: *Deleted

## 2013-07-24 NOTE — Telephone Encounter (Signed)
Spoke with patient who was asking if she could take anything else for her back pain. I advised she could take Aleve every 12 hours and alternate with tylenol every 6 hours. Encouraged pt to alternate heat and ice for pain relief as well.

## 2013-07-25 ENCOUNTER — Ambulatory Visit: Payer: BC Managed Care – PPO | Admitting: Nurse Practitioner

## 2013-07-25 ENCOUNTER — Ambulatory Visit: Payer: BC Managed Care – PPO | Admitting: Family Medicine

## 2013-08-09 ENCOUNTER — Telehealth: Payer: Self-pay | Admitting: Family Medicine

## 2013-08-09 DIAGNOSIS — F32A Depression, unspecified: Secondary | ICD-10-CM

## 2013-08-09 DIAGNOSIS — F329 Major depressive disorder, single episode, unspecified: Secondary | ICD-10-CM

## 2013-08-09 NOTE — Telephone Encounter (Signed)
Patient called and requested a refill for WELLBUTRIN XL 150 MG 24 hr tablet [facility]

## 2013-08-09 NOTE — Telephone Encounter (Signed)
Detailed message left advising apt due now for refills.      KP

## 2013-08-13 MED ORDER — WELLBUTRIN XL 150 MG PO TB24: 450.0000 mg | ORAL_TABLET | Freq: Every day | ORAL | Status: AC

## 2013-08-13 NOTE — Telephone Encounter (Signed)
Letter mailed to schedule OV.    KP 

## 2013-08-23 ENCOUNTER — Telehealth: Payer: Self-pay | Admitting: *Deleted

## 2013-08-23 NOTE — Telephone Encounter (Signed)
Received Wellness Program Form Insurance Wellness Program paperwork dropped off by pt.  Billing sheet attached and placed in folder for Dr. Laury AxonLowne.   Please call pt @ [phone]) when form is ready for pick-up.//AB/CMA

## 2013-08-28 ENCOUNTER — Other Ambulatory Visit: Payer: Self-pay

## 2013-08-28 DIAGNOSIS — Z1231 Encounter for screening mammogram for malignant neoplasm of breast: Secondary | ICD-10-CM

## 2013-08-28 NOTE — Telephone Encounter (Signed)
Pt called in checking status of paperwork.  Pt stated that they needed by Friday, 5/8.  Informed pt that someone would contact her when it was complete.

## 2013-08-31 ENCOUNTER — Encounter (INDEPENDENT_AMBULATORY_CARE_PROVIDER_SITE_OTHER): Payer: Self-pay

## 2013-08-31 ENCOUNTER — Ambulatory Visit
Admission: RE | Admit: 2013-08-31 | Discharge: 2013-08-31 | Disposition: A | Discharge status: Home / Self Care | Payer: BC Managed Care – PPO | Source: Ambulatory Visit

## 2013-08-31 DIAGNOSIS — Z1231 Encounter for screening mammogram for malignant neoplasm of breast: Secondary | ICD-10-CM

## 2013-09-05 ENCOUNTER — Ambulatory Visit: Payer: BC Managed Care – PPO

## 2013-09-05 NOTE — Telephone Encounter (Signed)
Form completed and picked up by the pt-per Jessica.//AB/CMA

## 2015-01-23 ENCOUNTER — Other Ambulatory Visit: Payer: Self-pay

## 2015-01-23 DIAGNOSIS — Z1231 Encounter for screening mammogram for malignant neoplasm of breast: Secondary | ICD-10-CM

## 2015-02-19 ENCOUNTER — Ambulatory Visit: Payer: Self-pay

## 2015-03-26 ENCOUNTER — Ambulatory Visit
Admission: RE | Admit: 2015-03-26 | Discharge: 2015-03-26 | Disposition: A | Discharge status: Home / Self Care | Payer: BLUE CROSS/BLUE SHIELD | Source: Ambulatory Visit

## 2015-03-26 DIAGNOSIS — Z1231 Encounter for screening mammogram for malignant neoplasm of breast: Secondary | ICD-10-CM

## 2015-07-02 ENCOUNTER — Ambulatory Visit: Payer: BLUE CROSS/BLUE SHIELD | Admitting: Family Medicine

## 2021-07-28 ENCOUNTER — Ambulatory Visit: Payer: BLUE CROSS/BLUE SHIELD | Admitting: Adult Health

## 2024-04-11 ENCOUNTER — Ambulatory Visit: Admitting: Adult Health

## 2024-04-17 ENCOUNTER — Telehealth: Admitting: Adult Health

## 2024-04-30 ENCOUNTER — Encounter: Payer: Self-pay | Admitting: Adult Health

## 2024-04-30 ENCOUNTER — Telehealth: Admitting: Adult Health

## 2024-04-30 DIAGNOSIS — F902 Attention-deficit hyperactivity disorder, combined type: Secondary | ICD-10-CM | POA: Diagnosis not present

## 2024-04-30 DIAGNOSIS — F5104 Psychophysiologic insomnia: Secondary | ICD-10-CM | POA: Diagnosis not present

## 2024-04-30 DIAGNOSIS — F411 Generalized anxiety disorder: Secondary | ICD-10-CM | POA: Diagnosis not present

## 2024-04-30 DIAGNOSIS — F331 Major depressive disorder, recurrent, moderate: Secondary | ICD-10-CM | POA: Diagnosis not present

## 2024-04-30 MED ORDER — CLONAZEPAM 0.5 MG PO TABS: 0.5000 mg | ORAL_TABLET | Freq: Every day | ORAL | 1 refills | Status: AC

## 2024-04-30 MED ORDER — ARMODAFINIL 50 MG PO TABS: ORAL_TABLET | ORAL | 1 refills | Status: DC

## 2024-04-30 NOTE — Progress Notes (Signed)
 Virtual Visit via Video Note  I connected with pt @ on 04/30/2024 at  3:00 PM EST by a video enabled telemedicine application and verified that I am speaking with the correct person using two identifiers.   I discussed the limitations of evaluation and management by telemedicine and the availability of in person appointments. The patient expressed understanding and agreed to proceed.  I discussed the assessment and treatment plan with the patient. The patient was provided an opportunity to ask questions and all were answered. The patient agreed with the plan and demonstrated an understanding of the instructions.   The patient was advised to call back or seek an in-person evaluation if the symptoms worsen or if the condition fails to improve as anticipated.  I provided 60 minutes of non-face-to-face time during this encounter.  The patient was located at home.  The provider was located at [facility].   Janet LOISE Sayers, NP   [facility] MD/PA/NP Initial Note  04/30/2024 3:52 PM Janet Carrillo  MRN:  [id number]  Chief Complaint:   HPI:   Patient seen today for initial psychiatric evaluation.   Describes mood today as not the best. Pleasant. Mood symptoms - reports sadness and low energy - numbness - nothing. Reports depression with loss of mother a year ago - 3/4 out of 10 - she was her primary care giver. Also reports husband with health issues and has been caring for him. Reports some anxiety - 3 out of 10. Stating I don't feel happy ,sad or anything really. Denies irritability. Reports decreased interest and motivation - I'm having to push myself. Reports feeling overwhelmed - laying down at night and her mind starts racing. Denies panic attacks. Reports worry, rumination and over thinking - occasionally. Denies obsessive thoughts or acts. Reports mood as stable - not good or bad. Taking medications as prescribed. She has been taking Wellbutrin  XL 150mg  - 3 every  morning for mood symptoms through intergrative provider. She was recently started on Abilify 2mg  daily for mood symptoms and reports medication as helpful, but caused weight gain. She is currently taking 1/4 of a 2mg  tablet and reports benefit. She felt like the higher dose numbed everything - and she lacked interest and motivation. She reports lower energy and difficulties with focus and concentration. She has taken a stimulant previously, but did not tolerate it. Reports recent labs were all normal. She is willing to consider other treatment options to help with mood stabilization. Energy levels lower. Active, does not have a regular exercise routine - planning to start.  Enjoys some usual interests and activities. Spending time with family. Appetite adequate. Weight gain - 8 pounds with restarting Abilify. Reports sleeping difficulties. Averages 6 hours of broken sleep. Denies daytime napping. Reports focus and concentration difficulties - using Vyvanse currently - may be too activating. Completing tasks. Managing aspects of household. Self employed. Denies SI or HI.  Denies AH or VH. Denies self harm. Denies substance use. Denies alcohol use.  Previous medication trials:  Wellbutrin , SSRI's, Vyvanse, Abilify  Visit Diagnosis:    ICD-10-CM   1. Psychophysiological insomnia  F51.04     2. Major depressive disorder, recurrent episode, moderate (HCC)  F33.1     3. Generalized anxiety disorder  F41.1     4. Attention deficit hyperactivity disorder (ADHD), combined type  F90.2       Past Psychiatric History: Denies psychiatric hospitalization.   Past Medical History:  Past Medical History:  Diagnosis Date   Depression  Wellbutrin    H/O seasonal allergies    Hypothyroidism    Urinary incontinence    Stress incontinence    Past Surgical History:  Procedure Laterality Date   CESAREAN SECTION     x's 2    Family Psychiatric History: Denies any family history of mental  illness.   Family History:  Family History  Problem Relation Age of Onset   Arthritis Mother    Hyperlipidemia Mother    Hyperlipidemia Father    Heart disease Mother        Heart Attack   Heart disease Father        Heart Attack   Hypertension Mother    Hypertension Father    Kidney disease Father    Diabetes Mother    Diabetes Father    Anxiety disorder Mother    Anxiety disorder Father     Social History:  Social History   Socioeconomic History   Marital status: Married    Spouse name: Not on file   Number of children: Not on file   Years of education: Not on file   Highest education level: Not on file  Occupational History    Employer: Self-employed    Comment: self employed  Tobacco Use   Smoking status: Never   Smokeless tobacco: Never  Substance and Sexual Activity   Alcohol use: No   Drug use: No   Sexual activity: Not on file  Other Topics Concern   Not on file  Social History Narrative   Exercise-- no   Social Drivers of Health   Tobacco Use: Low Risk (04/30/2024)   Patient History    Smoking Tobacco Use: Never    Smokeless Tobacco Use: Never    Passive Exposure: Not on file  Financial Resource Strain: Not on file  Food Insecurity: Low Risk (01/06/2023)   Received from [facility]   Epic    Within the past 12 months, you worried that your food would run out before you got money to buy more: Never true    Within the past 12 months, the food you bought just didn't last and you didn't have money to get more. : Never true  Transportation Needs: No Transportation Needs (01/06/2023)   Received from [facility]    In the past 12 months, has lack of reliable transportation kept you from medical appointments, meetings, work or from getting things needed for daily living? : No  Physical Activity: Not on file  Stress: Not on file  Social Connections: Not on file  Depression (EYV7-0): Not on file  Alcohol Screen: Not on file  Housing:  Medium Risk (01/06/2023)   Received from [facility]   Epic    What is your living situation today?: I have a steady place to live    Think about the place you live. Do you have problems with any of the following? Choose all that apply:: Mold  Utilities: Low Risk (01/06/2023)   Received from [facility]   Utilities    In the past 12 months has the electric, gas, oil, or water company threatened to shut off services in your home? : No  Health Literacy: Not on file    Allergies: Allergies[1]  Metabolic Disorder Labs: No results found for: HGBA1C, MPG No results found for: PROLACTIN No results found for: CHOL, TRIG, HDL, CHOLHDL, VLDL, LDLCALC Lab Results  Component Value Date   TSH 2.09 03/03/2006    Therapeutic Level Labs: No results found  for: LITHIUM No results found for: VALPROATE No results found for: CBMZ  Current Medications: Current Outpatient Medications  Medication Sig Dispense Refill   B-COMPLEX CAPS Take by mouth.     Cholecalciferol (VITAMIN D3) 5000 UNITS CAPS Take by mouth.     co-enzyme Q-10 30 MG capsule Take 30 mg by mouth daily.     Estradiol POWD Biest     liothyronine (CYTOMEL) 50 MCG tablet Take 50 mcg by mouth daily.     PCCA T3 SODIUM POWD Bi-Est     Progesterone Micronized (PROGESTERONE, BULK,) POWD Bi-est     vitamin C (ASCORBIC ACID) 500 MG tablet Take 500 mg by mouth daily.     WELLBUTRIN  XL 150 MG 24 hr tablet Take 3 tablets (450 mg total) by mouth daily. 270 tablet 0   No current facility-administered medications for this visit.    Medication Side Effects: none  Orders placed this visit:  No orders of the defined types were placed in this encounter.   Psychiatric Specialty Exam:  Review of Systems  Musculoskeletal:  Negative for gait problem.  Neurological:  Negative for tremors.  Psychiatric/Behavioral:         Please refer to HPI    There were no vitals taken for this visit.There is no height or weight on  file to calculate BMI.  General Appearance: Casual and Neat  Eye Contact:  Good  Speech:  Clear and Coherent and Normal Rate  Volume:  Normal  Mood:  Anxious and Depressed  Affect:  Appropriate and Congruent  Thought Process:  Coherent and Descriptions of Associations: Intact  Orientation:  Full (Time, Place, and Person)  Thought Content: Logical   Suicidal Thoughts:  No  Homicidal Thoughts:  No  Memory:  WNL  Judgement:  Good  Insight:  Good  Psychomotor Activity:  Normal  Concentration:  Concentration: Good and Attention Span: Good  Recall:  Good  Fund of Knowledge: Good  Language: Good  Assets:  Communication Skills Desire for Improvement Financial Resources/Insurance Housing Intimacy Leisure Time Physical Health Resilience Social Support Talents/Skills Transportation Vocational/Educational  ADL's:  Intact  Cognition: WNL  Prognosis:  Good   Screenings:  [facility] Office Visit from 05/30/2012 in [facility] at  [employer] Total Score 0    Receiving Psychotherapy: Yes   Treatment Plan/Recommendations:   Plan:  PDMP reviewed  Add:  Nuvigil  50mg  daily - start with 1/2 tablet Clonazepam  0.5mg  daily - taking 1/4 tablet at bedtime - continue for now with sleep issue  Continue: Wellbutrin  XL 150mg  - 3 daily brand name only Abilify 2mg  - taking 1/4 tablet daily  RTC 4 weeks  60 minutes spent dedicated to the care of this patient on the date of this encounter to include pre-visit review of records, ordering of medication, post visit documentation, and face-to-face time with the patient discussing depression, anxiety, insomnia and ADD. Discussed adding Nuvigil  50mg  - 1/2 tablet daily to start. Will continue Clonazepam  as needed for sleep - plan to take nightly for now. Continue Wellbutrin  and Abilify at current dose.  Discussed potential benefits, risk, and side effects of benzodiazepines to include potential risk of tolerance  and dependence, as well as possible drowsiness.  Advised patient not to drive if experiencing drowsiness and to take lowest possible effective dose to minimize risk of dependence and tolerance.   Discussed potential benefits, risks, and side effects of stimulants with patient to include increased heart rate, palpitations, insomnia, increased  anxiety, increased irritability, or decreased appetite.  Instructed patient to contact office if experiencing any significant tolerability issues.   Discussed potential metabolic side effects associated with atypical antipsychotics, as well as potential risk for movement side effects. Advised pt to contact office if movement side effects occur.    Patient advised to contact office with any questions, adverse effects, or acute worsening in signs and symptoms.  Janet LOISE Sayers, NP               [1] No Known Allergies

## 2024-05-18 ENCOUNTER — Telehealth: Payer: Self-pay | Admitting: Adult Health

## 2024-05-18 ENCOUNTER — Other Ambulatory Visit: Payer: Self-pay

## 2024-05-18 DIAGNOSIS — F902 Attention-deficit hyperactivity disorder, combined type: Secondary | ICD-10-CM

## 2024-05-18 MED ORDER — ARMODAFINIL 50 MG PO TABS: ORAL_TABLET | ORAL | 1 refills | Status: AC

## 2024-05-18 NOTE — Telephone Encounter (Signed)
 Next visit is 05/31/24. Janet Carrillo is requesting a refill on Ar modafinil 50 mg called to:   [id number] [place], [place] - [address] AT [address]   Phone: [phone]  Fax: [phone]

## 2024-05-18 NOTE — Telephone Encounter (Addendum)
 Pharmacy filled 1/6, too early for a RF, but she does have a RF available. LVM per DPR with this information.   Pt called back and said she wanted to increase her dose from 50 to 75 mg and if she did that she would run out early.

## 2024-05-18 NOTE — Telephone Encounter (Signed)
 Pended 75 mg to [facility]. Pt notified.

## 2024-05-31 ENCOUNTER — Telehealth: Admitting: Adult Health

## 2024-06-11 ENCOUNTER — Ambulatory Visit: Admitting: Adult Health
